# Patient Record
Sex: Female | Born: 1965 | Race: White | Hispanic: No | Marital: Married | Smoking: Never smoker
Health system: Southern US, Community
[De-identification: ages and names within clinical notes are randomized; demographics above are authoritative.]

## PROBLEM LIST (undated history)

## (undated) DIAGNOSIS — F40240 Claustrophobia: Secondary | ICD-10-CM

## (undated) DIAGNOSIS — E669 Obesity, unspecified: Secondary | ICD-10-CM

## (undated) DIAGNOSIS — R51 Headache: Secondary | ICD-10-CM

## (undated) DIAGNOSIS — E7800 Pure hypercholesterolemia, unspecified: Secondary | ICD-10-CM

## (undated) DIAGNOSIS — J309 Allergic rhinitis, unspecified: Secondary | ICD-10-CM

## (undated) DIAGNOSIS — R519 Headache, unspecified: Secondary | ICD-10-CM

## (undated) DIAGNOSIS — R0600 Dyspnea, unspecified: Secondary | ICD-10-CM

## (undated) DIAGNOSIS — D3A026 Benign carcinoid tumor of the rectum: Secondary | ICD-10-CM

## (undated) DIAGNOSIS — D649 Anemia, unspecified: Secondary | ICD-10-CM

## (undated) HISTORY — PX: DILATION AND CURETTAGE OF UTERUS: SHX78

---

## 1993-10-13 HISTORY — PX: DILATION AND CURETTAGE, DIAGNOSTIC / THERAPEUTIC: SUR384

## 2005-12-31 ENCOUNTER — Ambulatory Visit: Payer: Self-pay | Admitting: Obstetrics and Gynecology

## 2007-01-05 ENCOUNTER — Ambulatory Visit: Payer: Self-pay | Admitting: Obstetrics and Gynecology

## 2008-01-07 ENCOUNTER — Ambulatory Visit: Payer: Self-pay | Admitting: Obstetrics and Gynecology

## 2009-01-09 ENCOUNTER — Ambulatory Visit: Payer: Self-pay | Admitting: Obstetrics and Gynecology

## 2010-01-14 ENCOUNTER — Ambulatory Visit: Payer: Self-pay | Admitting: Obstetrics and Gynecology

## 2011-01-21 ENCOUNTER — Ambulatory Visit: Payer: Self-pay | Admitting: Obstetrics and Gynecology

## 2012-01-27 ENCOUNTER — Ambulatory Visit: Payer: Self-pay | Admitting: Obstetrics and Gynecology

## 2013-02-01 ENCOUNTER — Ambulatory Visit: Payer: Self-pay | Admitting: Obstetrics and Gynecology

## 2014-04-25 ENCOUNTER — Ambulatory Visit: Payer: Self-pay | Admitting: Obstetrics and Gynecology

## 2014-06-02 ENCOUNTER — Ambulatory Visit: Payer: Self-pay | Admitting: Unknown Physician Specialty

## 2014-06-02 DIAGNOSIS — D3A026 Benign carcinoid tumor of the rectum: Secondary | ICD-10-CM

## 2014-06-02 HISTORY — DX: Benign carcinoid tumor of the rectum: D3A.026

## 2014-06-02 HISTORY — PX: COLONOSCOPY: SHX174

## 2014-06-09 LAB — PATHOLOGY REPORT

## 2014-08-03 HISTORY — PX: FLEXIBLE SIGMOIDOSCOPY: SHX1649

## 2015-04-27 ENCOUNTER — Other Ambulatory Visit: Payer: Self-pay | Admitting: Obstetrics and Gynecology

## 2015-04-27 DIAGNOSIS — Z1231 Encounter for screening mammogram for malignant neoplasm of breast: Secondary | ICD-10-CM

## 2015-05-01 ENCOUNTER — Ambulatory Visit
Admission: RE | Admit: 2015-05-01 | Discharge: 2015-05-01 | Disposition: A | Discharge status: Home / Self Care | Payer: BLUE CROSS/BLUE SHIELD | Source: Ambulatory Visit | Attending: Obstetrics and Gynecology | Admitting: Obstetrics and Gynecology

## 2015-05-01 DIAGNOSIS — Z1231 Encounter for screening mammogram for malignant neoplasm of breast: Secondary | ICD-10-CM | POA: Insufficient documentation

## 2016-11-06 ENCOUNTER — Other Ambulatory Visit: Payer: Self-pay | Admitting: Obstetrics and Gynecology

## 2016-11-06 DIAGNOSIS — Z1231 Encounter for screening mammogram for malignant neoplasm of breast: Secondary | ICD-10-CM

## 2016-11-20 ENCOUNTER — Ambulatory Visit
Admission: RE | Admit: 2016-11-20 | Discharge: 2016-11-20 | Disposition: A | Discharge status: Home / Self Care | Payer: BLUE CROSS/BLUE SHIELD | Source: Ambulatory Visit | Attending: Obstetrics and Gynecology | Admitting: Obstetrics and Gynecology

## 2016-11-20 DIAGNOSIS — Z1231 Encounter for screening mammogram for malignant neoplasm of breast: Secondary | ICD-10-CM | POA: Insufficient documentation

## 2017-10-12 ENCOUNTER — Encounter: Payer: Self-pay | Admitting: *Deleted

## 2017-10-14 ENCOUNTER — Ambulatory Visit
Admission: RE | Admit: 2017-10-14 | Discharge: 2017-10-14 | Disposition: A | Discharge status: Home / Self Care | Payer: BLUE CROSS/BLUE SHIELD | Source: Ambulatory Visit | Attending: Unknown Physician Specialty | Admitting: Unknown Physician Specialty

## 2017-10-14 ENCOUNTER — Ambulatory Visit: Payer: BLUE CROSS/BLUE SHIELD | Admitting: Registered Nurse

## 2017-10-14 ENCOUNTER — Encounter
Admission: RE | Disposition: A | Discharge status: Home / Self Care | Payer: Self-pay | Source: Ambulatory Visit | Attending: Unknown Physician Specialty

## 2017-10-14 DIAGNOSIS — K640 First degree hemorrhoids: Secondary | ICD-10-CM | POA: Diagnosis not present

## 2017-10-14 DIAGNOSIS — Z79899 Other long term (current) drug therapy: Secondary | ICD-10-CM | POA: Insufficient documentation

## 2017-10-14 DIAGNOSIS — Z6841 Body Mass Index (BMI) 40.0 and over, adult: Secondary | ICD-10-CM | POA: Insufficient documentation

## 2017-10-14 DIAGNOSIS — E7800 Pure hypercholesterolemia, unspecified: Secondary | ICD-10-CM | POA: Insufficient documentation

## 2017-10-14 DIAGNOSIS — Z85030 Personal history of malignant carcinoid tumor of large intestine: Secondary | ICD-10-CM | POA: Diagnosis not present

## 2017-10-14 HISTORY — DX: Headache: R51

## 2017-10-14 HISTORY — PX: COLONOSCOPY: SHX5424

## 2017-10-14 HISTORY — DX: Obesity, unspecified: E66.9

## 2017-10-14 HISTORY — DX: Headache, unspecified: R51.9

## 2017-10-14 HISTORY — DX: Allergic rhinitis, unspecified: J30.9

## 2017-10-14 HISTORY — DX: Anemia, unspecified: D64.9

## 2017-10-14 HISTORY — DX: Benign carcinoid tumor of the rectum: D3A.026

## 2017-10-14 HISTORY — DX: Pure hypercholesterolemia, unspecified: E78.00

## 2017-10-14 LAB — POCT PREGNANCY, URINE: PREG TEST UR: NEGATIVE

## 2017-10-14 SURGERY — COLONOSCOPY
Anesthesia: General

## 2017-10-14 MED ORDER — LIDOCAINE HCL (PF) 1 % IJ SOLN
INTRAMUSCULAR | Status: AC
  Administered 2017-10-14: 0.3 mL via INTRADERMAL
  Filled 2017-10-14: qty 2

## 2017-10-14 MED ORDER — SODIUM CHLORIDE 0.9 % IV SOLN
INTRAVENOUS | Status: DC
  Administered 2017-10-14: 1000 mL via INTRAVENOUS
  Administered 2017-10-14: 12:00:00 via INTRAVENOUS

## 2017-10-14 MED ORDER — PROPOFOL 500 MG/50ML IV EMUL
INTRAVENOUS | Status: DC | PRN
  Administered 2017-10-14: 100 ug/kg/min via INTRAVENOUS

## 2017-10-14 MED ORDER — SODIUM CHLORIDE 0.9 % IV SOLN: INTRAVENOUS | Status: DC

## 2017-10-14 MED ORDER — PROPOFOL 10 MG/ML IV BOLUS
INTRAVENOUS | Status: DC | PRN
  Administered 2017-10-14 (×2): 20 mg via INTRAVENOUS

## 2017-10-14 MED ORDER — LIDOCAINE HCL (PF) 1 % IJ SOLN
2.0000 mL | Freq: Once | INTRAMUSCULAR | Status: AC
  Administered 2017-10-14: 0.3 mL via INTRADERMAL

## 2017-10-14 NOTE — Anesthesia Post-op Follow-up Note (Signed)
Anesthesia QCDR form completed.        

## 2017-10-14 NOTE — H&P (Signed)
Primary Care Physician:  Ezequiel Kayser, MD Primary Gastroenterologist:  Dr. Vira Agar  Pre-Procedure History & Physical: HPI:  Daisy Rivera is a 52 y.o. female is here for an colonoscopy.   Past Medical History:  Diagnosis Date  . Allergic rhinitis   . Anemia   . 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 Headache   . Hypercholesterolemia   . Obesity     Past Surgical History:  Procedure Laterality Date  . CESAREAN SECTION  1999  . COLONOSCOPY  06/02/2014  . DILATION AND CURETTAGE, DIAGNOSTIC / THERAPEUTIC  1995  . FLEXIBLE SIGMOIDOSCOPY  08/03/2014    Prior to Admission medications   Medication Sig Start Date End Date Taking? Authorizing Provider  Calcium Citrate-Vitamin D 200-250 MG-UNIT TABS Take by mouth.   Yes [provider]  diphenhydrAMINE (BENADRYL) 12.5 MG/5ML elixir Take by mouth 4 (four) times daily as needed.   Yes [provider]  EPINEPHrine 0.3 mg/0.3 mL IJ SOAJ injection Inject into the muscle once.   Yes [provider]  Ferrous Fumarate (HEMOCYTE - 106 MG FE) 324 (106 Fe) MG TABS tablet Take 1 tablet by mouth.   Yes [provider]  ferrous sulfate 325 (65 FE) MG tablet Take 325 mg by mouth daily with breakfast.   Yes [provider]  fexofenadine (ALLEGRA) 180 MG tablet Take 180 mg by mouth daily.   Yes [provider]  glucosamine-chondroitin 500-400 MG tablet Take 1 tablet by mouth 3 (three) times daily.   Yes [provider]  ibuprofen (ADVIL,MOTRIN) 600 MG tablet Take 600 mg by mouth every 6 (six) hours as needed.   Yes [provider]  Inulin 2 g CHEW Chew by mouth.   Yes [provider]  medroxyPROGESTERone (PROVERA) 10 MG tablet Take 10 mg by mouth daily.   Yes [provider]  Multiple Vitamin (MULTIVITAMIN) capsule Take 1 capsule by mouth daily.   Yes [provider]  Omega-3 Fatty Acids (OMEGA-3 EPA FISH OIL PO) Take by mouth.   Yes [provider]  vitamin C (ASCORBIC ACID) 500 MG tablet Take 500 mg by mouth daily.   Yes [provider]    Allergies as of 08/31/2017  . (Not on File)    Family History  Problem Relation Age of Onset  . Hypertension Mother   . Cerebral aneurysm Mother   . Dementia Father   . Hypertension Father   . Diabetes Sister   . Breast cancer Neg Hx     Social History   Socioeconomic History  . Marital status: Married    Spouse name: Not on file  . Number of children: Not on file  . Years of education: Not on file  . Highest education level: Not on file  Social Needs  . Financial resource strain: Not on file  . Food insecurity - worry: Not on file  . Food insecurity - inability: Not on file  . Transportation needs - medical: Not on file  . Transportation needs - non-medical: Not on file  Occupational History  . Not on file  Tobacco Use  . Smoking status: Never Smoker  . Smokeless tobacco: Never Used  Substance and Sexual Activity  . Alcohol use: Yes    Frequency: Never    Comment: rarely  . Drug use: No  . Sexual activity: Yes  Other Topics Concern  . Not on file  Social History Narrative  . Not on file  Review of Systems: See HPI, otherwise negative ROS  Physical Exam: BP (!) 164/75   Pulse 80   Temp 98.6 F (37 C) (Tympanic)   Resp 18   Ht 5\' 9"  (1.753 m)   Wt (!) 149.7 kg (330 lb)   SpO2 100%   BMI 48.73 kg/m  General:   Alert,  pleasant and cooperative in NAD Head:  Normocephalic and atraumatic. Neck:  Supple; no masses or thyromegaly. Lungs:  Clear throughout to auscultation.    Heart:  Regular rate and rhythm. Abdomen:  Soft, nontender and nondistended. Normal bowel sounds, without guarding, and without rebound.   Neurologic:  Alert and  oriented x4;  grossly normal neurologically.  Impression/Plan: Daisy Rivera is here for an colonoscopy to be performed for follow up carcinoid colon tumor.  Risks, benefits, limitations, and  alternatives regarding  colonoscopy have been reviewed with the patient.  Questions have been answered.  All parties agreeable.   Gaylyn Cheers, MD  10/14/2017, 11:49 AM

## 2017-10-14 NOTE — Op Note (Signed)
[facility] Gastroenterology Patient Name: Daisy Rivera Procedure Date: 10/14/2017 11:39 AM MRN: [id number] Account #: [id number] Date of Birth: [birth year] Admit Type: Outpatient Age: 52 Room: [facility] ENDO ROOM 3 Gender: Female Note Status: Finalized Procedure:            Colonoscopy Indications:          Follow up carcinoid tumor. Providers:            Manya Silvas, MD Complications:        No immediate complications. Procedure:            Pre-Anesthesia Assessment:                       - After reviewing the risks and benefits, the patient                        was deemed in satisfactory condition to undergo the                        procedure.                       After obtaining informed consent, the colonoscope was                        passed under direct vision. Throughout the procedure,                        the patient's blood pressure, pulse, and oxygen                        saturations were monitored continuously. The                        Colonoscope was introduced through the anus and                        advanced to the the cecum, identified by appendiceal                        orifice and ileocecal valve. The colonoscopy was                        performed without difficulty. The patient tolerated the                        procedure well. The quality of the bowel preparation                        was excellent. Findings:      Internal hemorrhoids were found during endoscopy. The hemorrhoids were       small and Grade I (internal hemorrhoids that do not prolapse).      The exam was otherwise without abnormality. Prep was excellent. Impression:           - Internal hemorrhoids.                       - The examination was otherwise normal.                       - No specimens collected. Recommendation:       -  Repeat colonoscopy in 5 years for surveillance. Manya Silvas, MD 10/14/2017 12:38:20 PM This report has been signed  electronically. Number of Addenda: 0 Note Initiated On: 10/14/2017 11:39 AM Scope Withdrawal Time: 0 hours 10 minutes 8 seconds  Total Procedure Duration: 0 hours 19 minutes 39 seconds       [facility]

## 2017-10-14 NOTE — Transfer of Care (Signed)
Immediate Anesthesia Transfer of Care Note  Patient: Daisy Rivera  Procedure(s) Performed: COLONOSCOPY (N/A )  Patient Location: PACU  Anesthesia Type:General  Level of Consciousness: awake, alert  and oriented  Airway & Oxygen Therapy: Patient Spontanous Breathing  Post-op Assessment: Report given to RN  Post vital signs: Reviewed and stable  Last Vitals:  Vitals:   10/14/17 1139  BP: (!) 164/75  Pulse: 80  Resp: 18  Temp: 37 C  SpO2: 100%    Last Pain:  Vitals:   10/14/17 1139  TempSrc: Tympanic         Complications: No apparent anesthesia complications

## 2017-10-15 ENCOUNTER — Encounter: Payer: Self-pay | Admitting: Unknown Physician Specialty

## 2017-10-15 NOTE — Anesthesia Postprocedure Evaluation (Signed)
Anesthesia Post Note  Patient: Karen Chafe Head  Procedure(s) Performed: COLONOSCOPY (N/A )  Patient location during evaluation: PACU Anesthesia Type: General Level of consciousness: awake and alert Pain management: pain level controlled Vital Signs Assessment: post-procedure vital signs reviewed and stable Respiratory status: spontaneous breathing, nonlabored ventilation, respiratory function stable and patient connected to nasal cannula oxygen Cardiovascular status: blood pressure returned to baseline and stable Postop Assessment: no apparent nausea or vomiting Anesthetic complications: no     Last Vitals:  Vitals:   10/14/17 1244 10/14/17 1254  BP: 128/68 122/68  Pulse: 70 74  Resp: 18 16  Temp:    SpO2: 98% 98%    Last Pain:  Vitals:   10/15/17 0724  TempSrc:   PainSc: 0-No pain                 Molli Barrows

## 2017-10-15 NOTE — Anesthesia Preprocedure Evaluation (Signed)
Anesthesia Evaluation  Patient identified by MRN, date of birth, ID band Patient awake    Reviewed: Allergy & Precautions, H&P , NPO status , Patient's Chart, lab work & pertinent test results, reviewed documented beta blocker date and time   Airway Mallampati: II   Neck ROM: full    Dental  (+) Poor Dentition   Pulmonary neg pulmonary ROS,    Pulmonary exam normal        Cardiovascular negative cardio ROS Normal cardiovascular exam Rhythm:regular Rate:Normal     Neuro/Psych  Headaches, negative neurological ROS  negative psych ROS   GI/Hepatic negative GI ROS, Neg liver ROS,   Endo/Other  negative endocrine ROSMorbid obesity  Renal/GU negative Renal ROS  negative genitourinary   Musculoskeletal   Abdominal   Peds  Hematology negative hematology ROS (+) anemia ,   Anesthesia Other Findings Past Medical History: No date: Allergic rhinitis No date: Anemia 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 No date: Headache No date: Hypercholesterolemia No date: Obesity Past Surgical History: 1999: CESAREAN SECTION 06/02/2014: COLONOSCOPY 10/14/2017: COLONOSCOPY; N/A     Comment:  Procedure: COLONOSCOPY;  Surgeon: Manya Silvas, MD;              Location: [facility] ENDOSCOPY;  Service: Endoscopy;                Laterality: N/A; 1995: DILATION AND CURETTAGE, DIAGNOSTIC / THERAPEUTIC 08/03/2014: FLEXIBLE SIGMOIDOSCOPY BMI    Body Mass Index:  48.73 kg/m     Reproductive/Obstetrics negative OB ROS                             Anesthesia Physical Anesthesia Plan  ASA: III  Anesthesia Plan: General   Post-op Pain Management:    Induction:   PONV Risk Score and Plan:   Airway Management Planned:   Additional Equipment:   Intra-op Plan:   Post-operative Plan:   Informed Consent: I have reviewed the patients History and Physical, chart, labs and discussed the procedure including  the risks, benefits and alternatives for the proposed anesthesia with the patient or authorized representative who has indicated his/her understanding and acceptance.   Dental Advisory Given  Plan Discussed with: CRNA  Anesthesia Plan Comments:         Anesthesia Quick Evaluation

## 2017-11-12 ENCOUNTER — Other Ambulatory Visit: Payer: Self-pay | Admitting: Obstetrics and Gynecology

## 2017-11-12 DIAGNOSIS — Z1239 Encounter for other screening for malignant neoplasm of breast: Secondary | ICD-10-CM

## 2017-12-08 NOTE — H&P (Signed)
Daisy Rivera is a 52 y.o. female here for [place] . Follow up for elevated FT4 and PMB   off biotin FT4 normalized   embx : proliferative   SIS today : endometrial mass 1.46x1.2x1.54cm  Past Medical History:  has a past medical history of Anemia, unspecified, Benign carcinoid tumor of rectum (06/02/2014), Chronic headaches, Hypercholesterolemia, Obesity, unspecified, and Seasonal allergic rhinitis.  Past Surgical History:  has a past surgical history that includes Cesarean section (1999); Colonoscopy (06/02/2014); Sigmoidoscopy Flexible (08/03/2014); Dilation and curettage, diagnostic / therapeutic (1995); and Colonoscopy (10/14/2017). Family History: family history includes Cerebral aneurysm in her mother; Dementia in her father; Diabetes in her sister; Diabetes type II in her sister; High blood pressure (Hypertension) in her father and mother; No Known Problems in her paternal grandfather. Social History:  reports that she has never smoked. She has never used smokeless tobacco. She reports that she drinks alcohol. She reports that she does not use drugs. OB/GYN History:          OB History    Gravida  3   Para  2   Term  2   Preterm      AB  1   Living  2     SAB      TAB      Ectopic      Molar      Multiple      Live Births  2          Allergies: is allergic to codeine and vicodin [hydrocodone-acetaminophen]. Medications:  Current Outpatient Medications:  .  ALLEGRA ALLERGY 180 mg tablet, TK 1 T PO QD, Disp: , Rfl: 3 .  ascorbic acid (VITAMIN C) 500 MG tablet, Take 500 mg by mouth once daily., Disp: , Rfl:  .  aspirin 81 MG EC tablet, Take 81 mg by mouth once daily, Disp: , Rfl:  .  calcium citrate-vitamin D3 (CITRACAL+D PETITES) 200 mg calcium -250 unit tablet, Take 1 tablet by mouth once daily., Disp: , Rfl:  .  diphenhydrAMINE (BENADRYL) 12.5 mg/5 mL elixir, Take 12.5 mg by mouth once a week. After allergy shot, Disp: , Rfl:  .  EPINEPHrine  (EPIPEN) 0.3 mg/0.3 mL pen injector, , Disp: , Rfl: 11 .  ferrous fumarate/vit Bcomp,C (SUPER B COMPLEX ORAL), Take 1 tablet by mouth once daily., Disp: , Rfl:  .  ferrous sulfate (IRON, FERROUS SULFATE,) 325 (65 FE) MG tablet, Take 325 mg by mouth 2 (two) times daily with meals., Disp: , Rfl:  .  GLUC/CHON-MSM#1/VIT C/MANG/BOR (GLUCOSAMINE-CHOND-MSM COMPLEX ORAL), Take 2 tablets by mouth once daily., Disp: , Rfl:  .  ibuprofen (ADVIL,MOTRIN) 600 MG tablet, Take 600 mg by mouth every 6 (six) hours as needed for Pain., Disp: , Rfl:  .  inulin-sorbitol 2 gram Chew, Take 3 tablets by mouth once daily.  , Disp: , Rfl:  .  medroxyPROGESTERone (PROVERA) 10 MG tablet, TAKE 1 TABLET(10 MG) BY MOUTH EVERY DAY, Disp: 10 tablet, Rfl: 0 .  multivitamin capsule, Take 1 capsule by mouth once daily., Disp: , Rfl:  .  omega-3 fatty acids/fish oil 340-1,000 mg capsule, Take 2 capsules by mouth once daily., Disp: , Rfl:  .  UNABLE TO FIND, Weekly allergy shots, Disp: , Rfl:   Review of Systems: General:                      No fatigue or weight loss Eyes:  No vision changes Ears:                            No hearing difficulty Respiratory:                No cough or shortness of breath Pulmonary:                  No asthma or shortness of breath Cardiovascular:           No chest pain, palpitations, dyspnea on exertion Gastrointestinal:          No abdominal bloating, chronic diarrhea, constipations, masses, pain or hematochezia Genitourinary:             No hematuria, dysuria, abnormal vaginal discharge, pelvic pain, Menometrorrhagia, + PMB  Lymphatic:                   No swollen lymph nodes Musculoskeletal:         No muscle weakness Neurologic:                  No extremity weakness, syncope, seizure disorder Psychiatric:                  No history of depression, delusions or suicidal/homicidal ideation    Exam:      Vitals:   12/02/17 1345  BP: 136/89  Pulse: 93     Body mass index is 48.44 kg/m.  WDWN white/ female in NAD   Lungs: CTA  CV : RRR without murmur   Neck:  no thyromegaly Abdomen: soft , no mass, normal active bowel sounds,  non-tender, no rebound tenderness Pelvic: tanner stage 5 ,  External genitalia: vulva /labia no lesions Urethra: no prolapse Vagina: normal physiologic d/c Cervix: no lesions, no cervical motion tenderness   Uterus: normal size shape and contour, non-tender Adnexa: no mass,  non-tender    Saline infusion sonohysterography: betadine prep to the cervix followed by placement of the HSG catheter into the endometrial canal . Sterile H2O is injected while performing a transvaginal u/s . Findings: 1.4x1.2cmx1.54 Impression:   The primary encounter diagnosis was PMB (postmenopausal bleeding). A diagnosis of Endometrial mass was also pertinent to this visit.    Plan:  Spoke to her about surgery to remove poly/ mass. Recommend Fx D+C and myosure resection of endometrial mass  Benefits and risks to surgery: The proposed benefit of the surgery has been discussed with the patient. The possible risks include, but are not limited to: organ injury to the bowel , bladder, ureters, and major blood vessels and nerves. There is a possibility of additional surgeries resulting from these injuries. There is also the risk of blood transfusion and the need to receive blood products during or after the procedure which may rarely lead to HIV or Hepatitis C infection. There is a risk of developing a deep venous thrombosis or a pulmonary embolism . There is the possibility of wound infection and also anesthetic complications, even the rare possibility of death. The patient understands these risks and wishes to proceed. All questions have been answered and the consent has been signed.     Return if symptoms worsen or fail to improve, for preop.  Caroline Sauger, MD

## 2017-12-09 ENCOUNTER — Encounter
Admission: RE | Admit: 2017-12-09 | Discharge: 2017-12-09 | Disposition: A | Discharge status: Home / Self Care | Payer: BLUE CROSS/BLUE SHIELD | Source: Ambulatory Visit | Attending: Obstetrics and Gynecology | Admitting: Obstetrics and Gynecology

## 2017-12-09 ENCOUNTER — Other Ambulatory Visit: Payer: Self-pay

## 2017-12-09 DIAGNOSIS — Z1231 Encounter for screening mammogram for malignant neoplasm of breast: Secondary | ICD-10-CM | POA: Diagnosis present

## 2017-12-09 DIAGNOSIS — Z01810 Encounter for preprocedural cardiovascular examination: Secondary | ICD-10-CM | POA: Insufficient documentation

## 2017-12-09 DIAGNOSIS — Z01812 Encounter for preprocedural laboratory examination: Secondary | ICD-10-CM | POA: Insufficient documentation

## 2017-12-09 DIAGNOSIS — R0602 Shortness of breath: Secondary | ICD-10-CM | POA: Insufficient documentation

## 2017-12-09 HISTORY — DX: Claustrophobia: F40.240

## 2017-12-09 HISTORY — DX: Dyspnea, unspecified: R06.00

## 2017-12-09 LAB — CBC
HCT: 42 % (ref 35.0–47.0)
Hemoglobin: 14 g/dL (ref 12.0–16.0)
MCH: 30.1 pg (ref 26.0–34.0)
MCHC: 33.3 g/dL (ref 32.0–36.0)
MCV: 90.5 fL (ref 80.0–100.0)
Platelets: 215 10*3/uL (ref 150–440)
RBC: 4.64 MIL/uL (ref 3.80–5.20)
RDW: 13.9 % (ref 11.5–14.5)
WBC: 4.4 10*3/uL (ref 3.6–11.0)

## 2017-12-09 LAB — BASIC METABOLIC PANEL
Anion gap: 7 (ref 5–15)
BUN: 17 mg/dL (ref 6–20)
CO2: 28 mmol/L (ref 22–32)
CREATININE: 0.66 mg/dL (ref 0.44–1.00)
Calcium: 9.3 mg/dL (ref 8.9–10.3)
Chloride: 105 mmol/L (ref 101–111)
GFR calc Af Amer: >60 mL/min (ref >60)
GFR calc non Af Amer: >60 mL/min (ref >60)
Glucose, Bld: 102 mg/dL — ABNORMAL HIGH (ref 65–99)
Potassium: 4.5 mmol/L (ref 3.5–5.1)
SODIUM: 140 mmol/L (ref 135–145)

## 2017-12-09 LAB — TYPE AND SCREEN
ABO/RH(D): A NEG
Antibody Screen: NEGATIVE

## 2017-12-09 NOTE — Pre-Procedure Instructions (Signed)
AS INSTRUCTED BY DR Lenna Sciara ADAMS, REQUEST FOR CLEARANCE/ ABNORMAL EKG,SOB CALLED AND FAXED TO DR THIESS. SPOKE WITH BARBARA. ALSO FAXED TO DR [place] SPOKE WITH JESSICA

## 2017-12-09 NOTE — Patient Instructions (Addendum)
Your procedure is scheduled on: 12/18/17 Fri Report to Same Day Surgery 2nd floor medical mall [facility] Entrance-take elevator on left to 2nd floor.  Check in with surgery information desk.) To find out your arrival time please call [phone] between 1PM - 3PM on 12/17/17 Thurs  Remember: Instructions that are not followed completely may result in serious medical risk, up to and including death, or upon the discretion of your surgeon and anesthesiologist your surgery may need to be rescheduled.    _x___ 1. Do not eat food after midnight the night before your procedure. You may drink clear liquids up to 2 hours before you are scheduled to arrive at the hospital for your procedure.  Do not drink clear liquids within 2 hours of your scheduled arrival to the hospital.  Clear liquids include  --Water or Apple juice without pulp  --Clear carbohydrate beverage such as ClearFast or Gatorade  --Black Coffee or Clear Tea (No milk, no creamers, do not add anything to                  the coffee or Tea Type 1 and type 2 diabetics should only drink water.  No gum chewing or hard candies.     __x__ 2. No Alcohol for 24 hours before or after surgery.   __x__3. No Smoking or e-cigarettes for 24 prior to surgery.  Do not use any chewable tobacco products for at least 6 hour prior to surgery   ____  4. Bring all medications with you on the day of surgery if instructed.    __x__ 5. Notify your doctor if there is any change in your medical condition     (cold, fever, infections).    x___6. On the morning of surgery brush your teeth with toothpaste and water.  You may rinse your mouth with mouth wash if you wish.  Do not swallow any toothpaste or mouthwash.   Do not wear jewelry, make-up, hairpins, clips or nail polish.  Do not wear lotions, powders, or perfumes. You may wear deodorant.  Do not shave 48 hours prior to surgery. Men may shave face and neck.  Do not bring valuables to the hospital.     [facility] is not responsible for any belongings or valuables.               Contacts, dentures or bridgework may not be worn into surgery.  Leave your suitcase in the car. After surgery it may be brought to your room.  For patients admitted to the hospital, discharge time is determined by your                       treatment team.  _  Patients discharged the day of surgery will not be allowed to drive home.  You will need someone to drive you home and stay with you the night of your procedure.    Please read over the following fact sheets that you were given:   [facility] Preparing for Surgery and or MRSA Information   _x___ Take anti-hypertensive listed below, cardiac, seizure, asthma,     anti-reflux and psychiatric medicines. These include:  1. NONE  2.  3.  4.  5.  6.  ____Fleets enema or Magnesium Citrate as directed.   ____ Use CHG Soap or sage wipes as directed on instruction sheet   ____ Use inhalers on the day of surgery and bring to hospital day of surgery  ____ Stop Metformin and Janumet 2 days prior to surgery.    ____ Take 1/2 of usual insulin dose the night before surgery and none on the morning     surgery.   _x___ Follow recommendations from Cardiologist, Pulmonologist or PCP regarding          stopping Aspirin, Coumadin, Plavix ,Eliquis, Effient, or Pradaxa, and Pletal.  X____Stop Anti-inflammatories such as Advil, Aleve, Ibuprofen, Motrin, Naproxen, Naprosyn, Goodies powders or aspirin products. OK to take Tylenol and    Celebrex. Stop Aspirin, ibuprofen and fish oils 1 week before surgery   _x___ Stop supplements until after surgery.  But may continue Vitamin D, Vitamin B,       and multivitamin.   ____ Bring C-Pap to the hospital.

## 2017-12-10 ENCOUNTER — Inpatient Hospital Stay: Admission: RE | Admit: 2017-12-10 | Payer: BLUE CROSS/BLUE SHIELD | Source: Ambulatory Visit

## 2017-12-10 ENCOUNTER — Ambulatory Visit
Admission: RE | Admit: 2017-12-10 | Discharge: 2017-12-10 | Disposition: A | Discharge status: Home / Self Care | Payer: BLUE CROSS/BLUE SHIELD | Source: Ambulatory Visit | Attending: Obstetrics and Gynecology | Admitting: Obstetrics and Gynecology

## 2017-12-10 DIAGNOSIS — Z1239 Encounter for other screening for malignant neoplasm of breast: Secondary | ICD-10-CM

## 2017-12-10 DIAGNOSIS — Z01812 Encounter for preprocedural laboratory examination: Secondary | ICD-10-CM | POA: Diagnosis not present

## 2017-12-15 NOTE — Pre-Procedure Instructions (Signed)
CLEARED BY DR CALLWOOD LOW RISK 12/14/17

## 2017-12-18 ENCOUNTER — Ambulatory Visit: Payer: BLUE CROSS/BLUE SHIELD | Admitting: Anesthesiology

## 2017-12-18 ENCOUNTER — Encounter: Payer: Self-pay | Admitting: Emergency Medicine

## 2017-12-18 ENCOUNTER — Encounter
Admission: RE | Disposition: A | Discharge status: Home / Self Care | Payer: Self-pay | Source: Ambulatory Visit | Attending: Obstetrics and Gynecology

## 2017-12-18 ENCOUNTER — Ambulatory Visit
Admission: RE | Admit: 2017-12-18 | Discharge: 2017-12-18 | Disposition: A | Discharge status: Home / Self Care | Payer: BLUE CROSS/BLUE SHIELD | Source: Ambulatory Visit | Attending: Obstetrics and Gynecology | Admitting: Obstetrics and Gynecology

## 2017-12-18 DIAGNOSIS — E7800 Pure hypercholesterolemia, unspecified: Secondary | ICD-10-CM | POA: Diagnosis not present

## 2017-12-18 DIAGNOSIS — Z7982 Long term (current) use of aspirin: Secondary | ICD-10-CM | POA: Insufficient documentation

## 2017-12-18 DIAGNOSIS — Z833 Family history of diabetes mellitus: Secondary | ICD-10-CM | POA: Insufficient documentation

## 2017-12-18 DIAGNOSIS — F419 Anxiety disorder, unspecified: Secondary | ICD-10-CM | POA: Diagnosis not present

## 2017-12-18 DIAGNOSIS — Z885 Allergy status to narcotic agent status: Secondary | ICD-10-CM | POA: Diagnosis not present

## 2017-12-18 DIAGNOSIS — Z79899 Other long term (current) drug therapy: Secondary | ICD-10-CM | POA: Diagnosis not present

## 2017-12-18 DIAGNOSIS — R51 Headache: Secondary | ICD-10-CM | POA: Insufficient documentation

## 2017-12-18 DIAGNOSIS — Z8249 Family history of ischemic heart disease and other diseases of the circulatory system: Secondary | ICD-10-CM | POA: Insufficient documentation

## 2017-12-18 DIAGNOSIS — N950 Postmenopausal bleeding: Secondary | ICD-10-CM | POA: Insufficient documentation

## 2017-12-18 DIAGNOSIS — N840 Polyp of corpus uteri: Secondary | ICD-10-CM | POA: Insufficient documentation

## 2017-12-18 DIAGNOSIS — Z6841 Body Mass Index (BMI) 40.0 and over, adult: Secondary | ICD-10-CM | POA: Insufficient documentation

## 2017-12-18 HISTORY — PX: DILATATION & CURETTAGE/HYSTEROSCOPY WITH MYOSURE: SHX6511

## 2017-12-18 LAB — POCT PREGNANCY, URINE: Preg Test, Ur: NEGATIVE

## 2017-12-18 LAB — ABO/RH: ABO/RH(D): A NEG

## 2017-12-18 SURGERY — DILATATION & CURETTAGE/HYSTEROSCOPY WITH MYOSURE
Anesthesia: General

## 2017-12-18 MED ORDER — CEFAZOLIN SODIUM-DEXTROSE 1-4 GM/50ML-% IV SOLN
INTRAVENOUS | Status: AC
  Filled 2017-12-18: qty 50

## 2017-12-18 MED ORDER — SILVER NITRATE-POT NITRATE 75-25 % EX MISC
CUTANEOUS | Status: DC | PRN
  Administered 2017-12-18: 3 via TOPICAL

## 2017-12-18 MED ORDER — ONDANSETRON HCL 4 MG/2ML IJ SOLN: 4.0000 mg | Freq: Once | INTRAMUSCULAR | Status: DC | PRN

## 2017-12-18 MED ORDER — SUCCINYLCHOLINE CHLORIDE 20 MG/ML IJ SOLN
INTRAMUSCULAR | Status: DC | PRN
  Administered 2017-12-18: 100 mg via INTRAVENOUS

## 2017-12-18 MED ORDER — MIDAZOLAM HCL 2 MG/2ML IJ SOLN
INTRAMUSCULAR | Status: AC
  Filled 2017-12-18: qty 4

## 2017-12-18 MED ORDER — SUGAMMADEX SODIUM 500 MG/5ML IV SOLN
INTRAVENOUS | Status: DC | PRN
  Administered 2017-12-18: 400 mg via INTRAVENOUS

## 2017-12-18 MED ORDER — FAMOTIDINE 20 MG PO TABS
20.0000 mg | ORAL_TABLET | Freq: Once | ORAL | Status: AC
  Administered 2017-12-18: 20 mg via ORAL

## 2017-12-18 MED ORDER — FAMOTIDINE 20 MG PO TABS
ORAL_TABLET | ORAL | Status: AC
Start: 2017-12-18 — End: 2017-12-18
  Administered 2017-12-18: 20 mg via ORAL
  Filled 2017-12-18: qty 1

## 2017-12-18 MED ORDER — CEFAZOLIN SODIUM-DEXTROSE 1-4 GM/50ML-% IV SOLN
INTRAVENOUS | Status: DC | PRN
  Administered 2017-12-18: 1 g via INTRAVENOUS

## 2017-12-18 MED ORDER — FENTANYL CITRATE (PF) 100 MCG/2ML IJ SOLN
INTRAMUSCULAR | Status: AC
  Filled 2017-12-18: qty 2

## 2017-12-18 MED ORDER — DEXAMETHASONE SODIUM PHOSPHATE 10 MG/ML IJ SOLN
INTRAMUSCULAR | Status: DC | PRN
  Administered 2017-12-18: 10 mg via INTRAVENOUS

## 2017-12-18 MED ORDER — ROCURONIUM BROMIDE 100 MG/10ML IV SOLN
INTRAVENOUS | Status: DC | PRN
  Administered 2017-12-18: 10 mg via INTRAVENOUS
  Administered 2017-12-18: 30 mg via INTRAVENOUS

## 2017-12-18 MED ORDER — PROPOFOL 10 MG/ML IV BOLUS
INTRAVENOUS | Status: AC
  Filled 2017-12-18: qty 20

## 2017-12-18 MED ORDER — CEFAZOLIN SODIUM-DEXTROSE 2-4 GM/100ML-% IV SOLN
INTRAVENOUS | Status: AC
  Filled 2017-12-18: qty 100

## 2017-12-18 MED ORDER — KETOROLAC TROMETHAMINE 30 MG/ML IJ SOLN
INTRAMUSCULAR | Status: AC
  Filled 2017-12-18: qty 1

## 2017-12-18 MED ORDER — PROPOFOL 10 MG/ML IV BOLUS
INTRAVENOUS | Status: DC | PRN
  Administered 2017-12-18: 200 mg via INTRAVENOUS

## 2017-12-18 MED ORDER — FENTANYL CITRATE (PF) 100 MCG/2ML IJ SOLN: 25.0000 ug | INTRAMUSCULAR | Status: DC | PRN

## 2017-12-18 MED ORDER — MIDAZOLAM HCL 2 MG/2ML IJ SOLN
INTRAMUSCULAR | Status: DC | PRN
  Administered 2017-12-18: 2 mg via INTRAVENOUS

## 2017-12-18 MED ORDER — LACTATED RINGERS IV SOLN: INTRAVENOUS | Status: DC

## 2017-12-18 MED ORDER — KETOROLAC TROMETHAMINE 30 MG/ML IJ SOLN
INTRAMUSCULAR | Status: DC | PRN
  Administered 2017-12-18: 30 mg via INTRAVENOUS

## 2017-12-18 MED ORDER — CEFAZOLIN SODIUM-DEXTROSE 2-4 GM/100ML-% IV SOLN
2.0000 g | Freq: Once | INTRAVENOUS | Status: AC
  Administered 2017-12-18: 2 g via INTRAVENOUS

## 2017-12-18 MED ORDER — LIDOCAINE HCL (CARDIAC) 20 MG/ML IV SOLN
INTRAVENOUS | Status: DC | PRN
  Administered 2017-12-18: 100 mg via INTRAVENOUS

## 2017-12-18 MED ORDER — FENTANYL CITRATE (PF) 100 MCG/2ML IJ SOLN
INTRAMUSCULAR | Status: DC | PRN
  Administered 2017-12-18: 100 ug via INTRAVENOUS

## 2017-12-18 MED ORDER — ONDANSETRON HCL 4 MG/2ML IJ SOLN
INTRAMUSCULAR | Status: DC | PRN
  Administered 2017-12-18: 4 mg via INTRAVENOUS

## 2017-12-18 MED ORDER — LACTATED RINGERS IV SOLN
INTRAVENOUS | Status: DC
  Administered 2017-12-18: 13:00:00 via INTRAVENOUS

## 2017-12-18 SURGICAL SUPPLY — 20 items
ABLATOR ENDOMETRIAL MYOSURE (ABLATOR) IMPLANT
CANISTER SUC SOCK COL 7IN (MISCELLANEOUS) ×3 IMPLANT
CANISTER SUCT 3000ML PPV (MISCELLANEOUS) ×3 IMPLANT
CATH ROBINSON RED A/P 16FR (CATHETERS) ×3 IMPLANT
DEVICE MYOSURE LITE (MISCELLANEOUS) ×3 IMPLANT
GLOVE BIO SURGEON STRL SZ8 (GLOVE) ×3 IMPLANT
GOWN STRL REUS W/ TWL LRG LVL3 (GOWN DISPOSABLE) ×1 IMPLANT
GOWN STRL REUS W/ TWL XL LVL3 (GOWN DISPOSABLE) ×1 IMPLANT
GOWN STRL REUS W/TWL LRG LVL3 (GOWN DISPOSABLE) ×2
GOWN STRL REUS W/TWL XL LVL3 (GOWN DISPOSABLE) ×2
KIT TURNOVER CYSTO (KITS) ×3 IMPLANT
PACK DNC HYST (MISCELLANEOUS) ×3 IMPLANT
PAD OB MATERNITY 4.3X12.25 (PERSONAL CARE ITEMS) ×3 IMPLANT
PAD PREP 24X41 OB/GYN DISP (PERSONAL CARE ITEMS) ×3 IMPLANT
SOL .9 NS 3000ML IRR  AL (IV SOLUTION) ×2
SOL .9 NS 3000ML IRR UROMATIC (IV SOLUTION) ×1 IMPLANT
TOWEL OR 17X26 4PK STRL BLUE (TOWEL DISPOSABLE) ×3 IMPLANT
TUBING CONNECTING 10 (TUBING) ×2 IMPLANT
TUBING CONNECTING 10' (TUBING) ×1
TUBING HYSTEROSCOPY DOLPHIN (MISCELLANEOUS) ×3 IMPLANT

## 2017-12-18 NOTE — Brief Op Note (Signed)
12/18/2017  2:00 PM  PATIENT:  Daisy Rivera  52 y.o. female  PRE-OPERATIVE DIAGNOSIS:  Endometrial Mass  PMB  POST-OPERATIVE DIAGNOSIS:  PMB , endometrial polyp  PROCEDURE:   Fractional D+C ,  Hysteroscopy resection of endometrial polyp with Myosure  SURGEON:  Surgeon(s) and Role:    * Davyn Morandi, Gwen Her, MD - Primary  PHYSICIAN ASSISTANT:   ASSISTANTS: none   ANESTHESIA:   general  EBL: 5 cc, IOF 600cc UO 200cc, deficit 180 cc NS BLOOD ADMINISTERED:none  DRAINS: none LOCAL MEDICATIONS USED:  NONE  SPECIMEN:  Source of Specimen:  ECC , endometrial polyp and endometrial curettings   DISPOSITION OF SPECIMEN:  PATHOLOGY  COUNTS:  YES  TOURNIQUET:  * No tourniquets in log *  DICTATION: .Other Dictation: Dictation Number verbal   PLAN OF CARE: Discharge to home after PACU  PATIENT DISPOSITION:  PACU - hemodynamically stable.   Delay start of Pharmacological VTE agent (>24hrs) due to surgical blood loss or risk of bleeding: not applicable

## 2017-12-18 NOTE — Anesthesia Post-op Follow-up Note (Signed)
Anesthesia QCDR form completed.        

## 2017-12-18 NOTE — Anesthesia Procedure Notes (Deleted)
Performed by: Rudean Hitt, CRNA

## 2017-12-18 NOTE — Anesthesia Postprocedure Evaluation (Signed)
Anesthesia Post Note  Patient: Daisy Rivera  Procedure(s) Performed: DILATATION & CURETTAGE/HYSTEROSCOPY WITH MYOSURE (N/A )  Patient location during evaluation: PACU Anesthesia Type: General Level of consciousness: awake and alert and oriented Pain management: pain level controlled Vital Signs Assessment: post-procedure vital signs reviewed and stable Respiratory status: spontaneous breathing Cardiovascular status: blood pressure returned to baseline Anesthetic complications: no     Last Vitals:  Vitals:   12/18/17 1457 12/18/17 1513  BP: (!) 149/77 (!) 148/71  Pulse: 62 68  Resp: 18 18  Temp:    SpO2: 100% 100%    Last Pain:  Vitals:   12/18/17 1513  TempSrc: Temporal  PainSc: 3                  Kaarin Pardy

## 2017-12-18 NOTE — Discharge Instructions (Signed)
AMBULATORY SURGERY  DISCHARGE INSTRUCTIONS   1) The drugs that you were given will stay in your system until tomorrow so for the next 24 hours you should not:  A) Drive an automobile B) Make any legal decisions C) Drink any alcoholic beverage   2) You may resume regular meals tomorrow.  Today it is better to start with liquids and gradually work up to solid foods.  You may eat anything you prefer, but it is better to start with liquids, then soup and crackers, and gradually work up to solid foods.   3) Please notify your doctor immediately if you have any unusual bleeding, trouble breathing, redness and pain at the surgery site, drainage, fever, or pain not relieved by medication.    Additional Instructions:        Dilation and Curettage or Vacuum Curettage, Care After These instructions give you information about caring for yourself after your procedure. Your doctor may also give you more specific instructions. Call your doctor if you have any problems or questions after your procedure. Follow these instructions at home: Activity Do not drive or use heavy machinery while taking prescription pain medicine. For 24 hours after your procedure, avoid driving. Take short walks often, followed by rest periods. Ask your doctor what activities are safe for you. After one or two days, you may be able to return to your normal activities. Do not lift anything that is heavier than 10 lb (4.5 kg) until your doctor approves. For at least 2 weeks, or as long as told by your doctor: Do not douche. Do not use tampons. Do not have sex. General instructions Take over-the-counter and prescription medicines only as told by your doctor. This is very important if you take blood thinning medicine. Do not take baths, swim, or use a hot tub until your doctor approves. Take showers instead of baths. Wear compression stockings as told by your doctor. It is up to you to get the results of your procedure.  Ask your doctor when your results will be ready. Keep all follow-up visits as told by your doctor. This is important. Contact a doctor if: You have very bad cramps that get worse or do not get better with medicine. You have very bad pain in your belly (abdomen). You cannot drink fluids without throwing up (vomiting). You get pain in a different part of the area between your belly and thighs (pelvis). You have bad-smelling discharge from your vagina. You have a rash. Get help right away if: You are bleeding a lot from your vagina. A lot of bleeding means soaking more than one sanitary pad in an hour, for 2 hours in a row. You have clumps of blood (blood clots) coming from your vagina. You have a fever or chills. Your belly feels very tender or hard. You have chest pain. You have trouble breathing. You cough up blood. You feel dizzy. You feel light-headed. You pass out (faint). You have pain in your neck or shoulder area. Summary Take short walks often, followed by rest periods. Ask your doctor what activities are safe for you. After one or two days, you may be able to return to your normal activities. Do not lift anything that is heavier than 10 lb (4.5 kg) until your doctor approves. Do not take baths, swim, or use a hot tub until your doctor approves. Take showers instead of baths. Contact your doctor if you have any symptoms of infection, like bad-smelling discharge from your vagina. This information  is not intended to replace advice given to you by your health care provider. Make sure you discuss any questions you have with your health care provider. Document Released: 07/08/2008 Document Revised: 06/16/2016 Document Reviewed: 06/16/2016 Elsevier Interactive Patient Education  2017 [employer]. 4)         Please contact your physician with any problems or Same Day Surgery at [phone], Monday through Friday 6 am to 4 pm, or [place] at [facility] number at  [phone].

## 2017-12-18 NOTE — Transfer of Care (Signed)
Immediate Anesthesia Transfer of Care Note  Patient: Daisy Rivera Bonus  Procedure(s) Performed: DILATATION & CURETTAGE/HYSTEROSCOPY WITH MYOSURE (N/A )  Patient Location: PACU  Anesthesia Type:General  Level of Consciousness: drowsy  Airway & Oxygen Therapy: Patient Spontanous Breathing  Post-op Assessment: Report given to RN and Post -op Vital signs reviewed and stable  Post vital signs: Reviewed and stable  Last Vitals:  Vitals:   12/18/17 1225 12/18/17 1413  BP: (!) 162/71 128/60  Pulse: 81 79  Resp: 17 12  Temp: (!) 36.3 C (!) 36.2 C  SpO2: 99% 100%    Last Pain:  Vitals:   12/18/17 1225  TempSrc: Temporal         Complications: No apparent anesthesia complications

## 2017-12-18 NOTE — Anesthesia Procedure Notes (Addendum)
Procedure Name: Intubation Date/Time: 12/18/2017 1:23 PM Performed by: Rudean Hitt, CRNA Pre-anesthesia Checklist: Patient identified, Emergency Drugs available, Suction available, Patient being monitored and Timeout performed Patient Re-evaluated:Patient Re-evaluated prior to induction Oxygen Delivery Method: Circle system utilized Preoxygenation: Pre-oxygenation with 100% oxygen Induction Type: IV induction Laryngoscope Size: Mac and 4 Grade View: Grade II Tube type: Oral Tube size: 7.0 mm Number of attempts: 1 Airway Equipment and Method: Stylet Placement Confirmation: ETT inserted through vocal cords under direct vision,  positive ETCO2,  CO2 detector and breath sounds checked- equal and bilateral Secured at: 21 cm Tube secured with: Tape

## 2017-12-18 NOTE — Anesthesia Procedure Notes (Deleted)
Performed by: Rudean Hitt, CRNA Oxygen Delivery Method: Simple face mask

## 2017-12-18 NOTE — Anesthesia Preprocedure Evaluation (Signed)
Anesthesia Evaluation  Patient identified by MRN, date of birth, ID band Patient awake    Reviewed: Allergy & Precautions, H&P , NPO status , Patient's Chart, lab work & pertinent test results, reviewed documented beta blocker date and time   Airway Mallampati: II  TM Distance: <3 FB Neck ROM: full    Dental  (+) Poor Dentition, Chipped   Pulmonary neg pulmonary ROS, shortness of breath and with exertion,    Pulmonary exam normal        Cardiovascular negative cardio ROS Normal cardiovascular exam Rhythm:regular Rate:Normal     Neuro/Psych  Headaches, Anxiety negative neurological ROS  negative psych ROS   GI/Hepatic negative GI ROS, Neg liver ROS,   Endo/Other  negative endocrine ROSMorbid obesity  Renal/GU negative Renal ROS  Female GU complaint     Musculoskeletal   Abdominal   Peds negative pediatric ROS (+)  Hematology negative hematology ROS (+) anemia ,   Anesthesia Other Findings Past Medical History: No date: Allergic rhinitis No date: Anemia 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 No date: Headache No date: Hypercholesterolemia No date: Obesity Past Surgical History: 1999: CESAREAN SECTION 06/02/2014: COLONOSCOPY 10/14/2017: COLONOSCOPY; N/A     Comment:  Procedure: COLONOSCOPY;  Surgeon: Manya Silvas, MD;              Location: [facility] ENDOSCOPY;  Service: Endoscopy;                Laterality: N/A; 1995: DILATION AND CURETTAGE, DIAGNOSTIC / THERAPEUTIC 08/03/2014: FLEXIBLE SIGMOIDOSCOPY BMI    Body Mass Index:  48.73 kg/m     Reproductive/Obstetrics negative OB ROS                             Anesthesia Physical  Anesthesia Plan  ASA: III  Anesthesia Plan: General   Post-op Pain Management:    Induction: Intravenous, Rapid sequence and Cricoid pressure planned  PONV Risk Score and Plan:   Airway Management Planned: Oral ETT  Additional Equipment:    Intra-op Plan:   Post-operative Plan: Extubation in OR  Informed Consent: I have reviewed the patients History and Physical, chart, labs and discussed the procedure including the risks, benefits and alternatives for the proposed anesthesia with the patient or authorized representative who has indicated his/her understanding and acceptance.   Dental Advisory Given  Plan Discussed with: CRNA and Surgeon  Anesthesia Plan Comments:         Anesthesia Quick Evaluation

## 2017-12-18 NOTE — Progress Notes (Signed)
Pt ready for Fx D+C and myosure . All questions answered  Proceed

## 2017-12-19 ENCOUNTER — Encounter: Payer: Self-pay | Admitting: Obstetrics and Gynecology

## 2017-12-19 NOTE — Op Note (Signed)
NAMEPERMELIA, BAMBA NO.:  [id number]  MEDICAL RECORD NO.:  [id number]  LOCATION:  [facility]                         FACILITY:  [facility]  PHYSICIAN:  Laverta Baltimore, MDDATE OF BIRTH:  [birth year]  DATE OF PROCEDURE: DATE OF DISCHARGE:                              OPERATIVE REPORT   PREOPERATIVE DIAGNOSIS: 1. Postmenopausal bleeding. 2. Endometrial polyp.  POSTOPERATIVE DIAGNOSIS: 1. Postmenopausal bleeding. 2. Endometrial polyp.  PROCEDURE PERFORMED: 1. Fractional dilation curettage. 2. Hysteroscopic resection of the endometrial polyp with MyoSure.  SURGEON:  Laverta Baltimore, MD  ANESTHESIA:  General endotracheal anesthesia.  INDICATIONS:  A 52 year old gravida 3, para 2 patient with a history of postmenopausal bleeding.  The patient underwent a saline infusion sonohysterography in the office that showed endometrial mass measuring 1.4 x 1.2 x 1.5 cm.  FINDINGS:  Endometrial polyp, posterior aspect of the endometrium.  DESCRIPTION OF PROCEDURE:  After adequate general endotracheal anesthesia, the patient was placed on dorsal supine position with the legs in the candy-cane stirrups.  The patient was prepped and draped in normal sterile fashion.  The patient did receive 3 g IV Ancef prior to commencement of the case.  Time-out was performed.  Straight catheterization of the bladder yielded 200 mL clear urine.  A weighted speculum was placed in the posterior vaginal vault, and the anterior cervix was grasped with single-tooth tenaculum.  An endocervical curettage was performed followed by uterine sounding to 8 cm.  The cervix was then dilated to #18 Hanks dilator without difficulty.  The hysteroscope was advanced into the endometrial cavity without difficulty and normal saline was used as distending medium.  Of note, there was a fleshy 1 x 1 cm polyp in the posterior aspect of the endometrial cavity. MyoSure was brought up to the operative field and  the polyp was resected in total.  Good hemostasis was noted.  Intraoperative pictures were taken.  The MyoSure and hysteroscope were removed and a gentle endometrial curetting was performed with no additional tissue, scant blood.  This will be sent to pathology as well.  The single-tooth tenaculum was removed.  Silver nitrate was used at the tenaculum sites. Good hemostasis was noted.  There were no complications.  ESTIMATED BLOOD LOSS:  5 mL.  INTRAOPERATIVE FLUIDS:  600 mL.  URINE OUTPUT:  200 mL net deficit.  Normal saline 180 mL.  The patient was taken to recovery room in good condition.          ______________________________ Laverta Baltimore, MD     TS/MEDQ  D:  12/18/2017  T:  12/19/2017  Job:  [id number]

## 2017-12-22 LAB — SURGICAL PATHOLOGY

## 2019-07-20 ENCOUNTER — Other Ambulatory Visit: Payer: Self-pay | Admitting: Otolaryngology

## 2019-07-20 ENCOUNTER — Ambulatory Visit
Admission: RE | Admit: 2019-07-20 | Discharge: 2019-07-20 | Disposition: A | Discharge status: Home / Self Care | Payer: BC Managed Care – PPO | Attending: Otolaryngology | Admitting: Otolaryngology

## 2019-07-20 ENCOUNTER — Other Ambulatory Visit: Payer: Self-pay

## 2019-07-20 ENCOUNTER — Ambulatory Visit
Admission: RE | Admit: 2019-07-20 | Discharge: 2019-07-20 | Disposition: A | Discharge status: Home / Self Care | Payer: BC Managed Care – PPO | Source: Ambulatory Visit | Attending: Otolaryngology | Admitting: Otolaryngology

## 2019-07-20 DIAGNOSIS — R52 Pain, unspecified: Secondary | ICD-10-CM | POA: Insufficient documentation

## 2019-09-22 ENCOUNTER — Other Ambulatory Visit: Payer: Self-pay | Admitting: Obstetrics and Gynecology

## 2019-09-22 DIAGNOSIS — Z1231 Encounter for screening mammogram for malignant neoplasm of breast: Secondary | ICD-10-CM

## 2019-09-28 ENCOUNTER — Ambulatory Visit
Admission: RE | Admit: 2019-09-28 | Discharge: 2019-09-28 | Disposition: A | Discharge status: Home / Self Care | Payer: BC Managed Care – PPO | Source: Ambulatory Visit | Attending: Obstetrics and Gynecology | Admitting: Obstetrics and Gynecology

## 2019-09-28 ENCOUNTER — Other Ambulatory Visit: Payer: Self-pay

## 2019-09-28 DIAGNOSIS — Z1231 Encounter for screening mammogram for malignant neoplasm of breast: Secondary | ICD-10-CM

## 2020-05-16 ENCOUNTER — Emergency Department
Admission: EM | Admit: 2020-05-16 | Discharge: 2020-05-16 | Disposition: A | Discharge status: Home / Self Care | Payer: BC Managed Care – PPO | Attending: Emergency Medicine | Admitting: Emergency Medicine

## 2020-05-16 ENCOUNTER — Emergency Department: Payer: BC Managed Care – PPO

## 2020-05-16 ENCOUNTER — Encounter: Payer: Self-pay | Admitting: Emergency Medicine

## 2020-05-16 ENCOUNTER — Other Ambulatory Visit: Payer: Self-pay

## 2020-05-16 DIAGNOSIS — R519 Headache, unspecified: Secondary | ICD-10-CM | POA: Insufficient documentation

## 2020-05-16 DIAGNOSIS — M79631 Pain in right forearm: Secondary | ICD-10-CM | POA: Insufficient documentation

## 2020-05-16 DIAGNOSIS — M7918 Myalgia, other site: Secondary | ICD-10-CM

## 2020-05-16 DIAGNOSIS — Y999 Unspecified external cause status: Secondary | ICD-10-CM | POA: Diagnosis not present

## 2020-05-16 DIAGNOSIS — Y939 Activity, unspecified: Secondary | ICD-10-CM | POA: Diagnosis not present

## 2020-05-16 DIAGNOSIS — Z7982 Long term (current) use of aspirin: Secondary | ICD-10-CM | POA: Diagnosis not present

## 2020-05-16 DIAGNOSIS — Y92410 Unspecified street and highway as the place of occurrence of the external cause: Secondary | ICD-10-CM | POA: Insufficient documentation

## 2020-05-16 DIAGNOSIS — R0789 Other chest pain: Secondary | ICD-10-CM | POA: Insufficient documentation

## 2020-05-16 DIAGNOSIS — Z85040 Personal history of malignant carcinoid tumor of rectum: Secondary | ICD-10-CM | POA: Diagnosis not present

## 2020-05-16 DIAGNOSIS — S161XXA Strain of muscle, fascia and tendon at neck level, initial encounter: Secondary | ICD-10-CM | POA: Diagnosis not present

## 2020-05-16 DIAGNOSIS — S199XXA Unspecified injury of neck, initial encounter: Secondary | ICD-10-CM | POA: Diagnosis present

## 2020-05-16 DIAGNOSIS — H9202 Otalgia, left ear: Secondary | ICD-10-CM | POA: Diagnosis not present

## 2020-05-16 MED ORDER — OXYCODONE-ACETAMINOPHEN 7.5-325 MG PO TABS: 1.0000 | ORAL_TABLET | Freq: Four times a day (QID) | ORAL | 0 refills | Status: AC | PRN

## 2020-05-16 MED ORDER — ORPHENADRINE CITRATE 30 MG/ML IJ SOLN
60.0000 mg | Freq: Two times a day (BID) | INTRAMUSCULAR | Status: DC
  Administered 2020-05-16: 60 mg via INTRAMUSCULAR
  Filled 2020-05-16: qty 2

## 2020-05-16 MED ORDER — CYCLOBENZAPRINE HCL 10 MG PO TABS: 10.0000 mg | ORAL_TABLET | Freq: Three times a day (TID) | ORAL | 0 refills | Status: AC | PRN

## 2020-05-16 MED ORDER — ONDANSETRON 8 MG PO TBDP
8.0000 mg | ORAL_TABLET | Freq: Once | ORAL | Status: AC
  Administered 2020-05-16: 8 mg via ORAL
  Filled 2020-05-16: qty 1

## 2020-05-16 MED ORDER — HYDROMORPHONE HCL 1 MG/ML IJ SOLN
1.0000 mg | Freq: Once | INTRAMUSCULAR | Status: AC
  Administered 2020-05-16: 1 mg via INTRAMUSCULAR
  Filled 2020-05-16: qty 1

## 2020-05-16 NOTE — Discharge Instructions (Addendum)
No acute findings on CT of the head and neck.  Follow discharge care instruction.  Continue previous medications.  Start Percocet and Flexeril as directed.  Follow-up PCP if no improvement.
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
## 2021-04-03 ENCOUNTER — Other Ambulatory Visit: Payer: Self-pay | Admitting: Orthopedic Surgery

## 2021-04-03 DIAGNOSIS — S46001A Unspecified injury of muscle(s) and tendon(s) of the rotator cuff of right shoulder, initial encounter: Secondary | ICD-10-CM

## 2021-04-03 DIAGNOSIS — M25311 Other instability, right shoulder: Secondary | ICD-10-CM

## 2021-04-13 ENCOUNTER — Ambulatory Visit
Admission: RE | Admit: 2021-04-13 | Discharge: 2021-04-13 | Disposition: A | Discharge status: Home / Self Care | Payer: BC Managed Care – PPO | Source: Ambulatory Visit | Attending: Orthopedic Surgery | Admitting: Orthopedic Surgery

## 2021-04-13 ENCOUNTER — Other Ambulatory Visit: Payer: Self-pay

## 2021-04-13 DIAGNOSIS — S46001A Unspecified injury of muscle(s) and tendon(s) of the rotator cuff of right shoulder, initial encounter: Secondary | ICD-10-CM

## 2021-04-13 DIAGNOSIS — M25311 Other instability, right shoulder: Secondary | ICD-10-CM

## 2021-05-17 ENCOUNTER — Other Ambulatory Visit: Payer: Self-pay | Admitting: Obstetrics and Gynecology

## 2021-05-17 DIAGNOSIS — Z1231 Encounter for screening mammogram for malignant neoplasm of breast: Secondary | ICD-10-CM

## 2021-07-02 ENCOUNTER — Ambulatory Visit
Admission: RE | Admit: 2021-07-02 | Discharge: 2021-07-02 | Disposition: A | Discharge status: Home / Self Care | Payer: BC Managed Care – PPO | Source: Ambulatory Visit | Attending: Obstetrics and Gynecology | Admitting: Obstetrics and Gynecology

## 2021-07-02 ENCOUNTER — Other Ambulatory Visit: Payer: Self-pay

## 2021-07-02 DIAGNOSIS — Z1231 Encounter for screening mammogram for malignant neoplasm of breast: Secondary | ICD-10-CM | POA: Insufficient documentation

## 2022-07-09 ENCOUNTER — Other Ambulatory Visit: Payer: Self-pay | Admitting: Obstetrics and Gynecology

## 2022-07-09 DIAGNOSIS — Z1231 Encounter for screening mammogram for malignant neoplasm of breast: Secondary | ICD-10-CM

## 2022-08-04 ENCOUNTER — Ambulatory Visit
Admission: RE | Admit: 2022-08-04 | Discharge: 2022-08-04 | Disposition: A | Discharge status: Home / Self Care | Payer: BC Managed Care – PPO | Source: Ambulatory Visit | Attending: Obstetrics and Gynecology | Admitting: Obstetrics and Gynecology

## 2022-08-04 DIAGNOSIS — Z1231 Encounter for screening mammogram for malignant neoplasm of breast: Secondary | ICD-10-CM | POA: Diagnosis present

## 2023-02-13 ENCOUNTER — Encounter: Payer: Self-pay | Admitting: Gastroenterology

## 2023-02-15 NOTE — H&P (Signed)
Pre-Procedure H&P   Patient ID: Daisy Rivera is a 57 y.o. female.  Gastroenterology Provider: Jaynie Collins, DO  Referring Provider: Laren Everts, NP PCP: Luciana Axe, NP  Date: 02/16/2023  HPI Ms. Daisy Rivera is a 57 y.o. female who presents today for Colonoscopy for surveillance- phx colon polyps and rectal carcinoid .  Patient last underwent colonoscopy in January 2019 demonstrating internal hemorrhoids.  She underwent colonoscopy demonstrating well-differentiated rectal that.  Repeat flex sig in October 2015 was negative  Daily bowel meant without melena hematochezia diarrhea or constipation.  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   Claustrophobia    Dyspnea    Headache    Hypercholesterolemia    Obesity     Past Surgical History:  Procedure Laterality Date   CESAREAN SECTION  1999   COLONOSCOPY  06/02/2014   COLONOSCOPY N/A 10/14/2017   Procedure: COLONOSCOPY;  Surgeon: Scot Jun, MD;  Location: [facility] ENDOSCOPY;  Service: Endoscopy;  Laterality: N/A;   DILATATION & CURETTAGE/HYSTEROSCOPY WITH MYOSURE N/A 12/18/2017   Procedure: DILATATION & CURETTAGE/HYSTEROSCOPY WITH MYOSURE;  Surgeon: Schermerhorn, Ihor Austin, MD;  Location: [facility] ORS;  Service: Gynecology;  Laterality: N/A;   DILATION AND CURETTAGE OF UTERUS     DILATION AND CURETTAGE, DIAGNOSTIC / THERAPEUTIC  1995   FLEXIBLE SIGMOIDOSCOPY  08/03/2014    Family History No h/o GI disease or malignancy  Review of Systems  Constitutional:  Negative for activity change, appetite change, chills, diaphoresis, fatigue, fever and unexpected weight change.  HENT:  Negative for trouble swallowing and voice change.   Respiratory:  Negative for shortness of breath and wheezing.   Cardiovascular:  Negative for chest pain, palpitations and leg swelling.   Gastrointestinal:  Negative for abdominal distention, abdominal pain, anal bleeding, blood in stool, constipation, diarrhea, nausea, rectal pain and vomiting.  Musculoskeletal:  Negative for arthralgias and myalgias.  Skin:  Negative for color change and pallor.  Neurological:  Negative for dizziness, syncope and weakness.  Psychiatric/Behavioral:  Negative for confusion.   All other systems reviewed and are negative.    Medications No current facility-administered medications on file prior to encounter.   Current Outpatient Medications on File Prior to Encounter  Medication Sig Dispense Refill   acetaminophen (TYLENOL) 500 MG tablet Take 1,000 mg by mouth 2 (two) times daily as needed for moderate pain or headache.     aspirin EC 81 MG tablet Take 81 mg by mouth at bedtime.     celecoxib (CELEBREX) 200 MG capsule Take 200 mg by mouth 2 (two) times daily.     Multiple Vitamin (MULTIVITAMIN) capsule Take 1 capsule by mouth daily.     Omega-3 Fatty Acids (FISH OIL) 500 MG CAPS Take 1,000 mg by mouth daily.     SUPER B COMPLEX/C PO Take 1 tablet by mouth daily.     vitamin C (ASCORBIC ACID) 500 MG tablet Take 500 mg by mouth daily.     Calcium Citrate-Vitamin D 200-250 MG-UNIT TABS Take 1 tablet by mouth daily.      cyclobenzaprine (FLEXERIL) 10 MG tablet Take 1 tablet (10 mg total) by mouth 3 (three) times daily as needed. 15 tablet 0   diphenhydrAMINE (BENADRYL) 12.5 MG/5ML elixir Take 12.5 mg by mouth daily as needed for allergies.      EPINEPHrine 0.3  mg/0.3 mL IJ SOAJ injection Inject 0.3 mg into the muscle as needed (allergic reactions).      ferrous sulfate 325 (65 FE) MG tablet Take 325 mg by mouth 2 (two) times daily.  (Patient not taking: Reported on 02/09/2023)     fexofenadine (ALLEGRA) 180 MG tablet Take 180 mg by mouth daily.     glucosamine-chondroitin 500-400 MG tablet Take 2 tablets by mouth daily.      ibuprofen (ADVIL,MOTRIN) 600 MG tablet Take 400 mg by mouth 2 (two)  times daily as needed for headache or moderate pain.  (Patient not taking: Reported on 02/16/2023)     Inulin 2 g CHEW Chew 6 g by mouth daily.      medroxyPROGESTERone (PROVERA) 10 MG tablet Take 10 mg by mouth daily as needed (irregular cycles PRN). For 10 days at a time     oxyCODONE-acetaminophen (PERCOCET) 7.5-325 MG tablet Take 1 tablet by mouth every 6 (six) hours as needed. 20 tablet 0   PRESCRIPTION MEDICATION Allergy shot weekly      Pertinent medications related to GI and procedure were reviewed by me with the patient prior to the procedure   Current Facility-Administered Medications:    0.9 %  sodium chloride infusion, , Intravenous, Continuous, Jaynie Collins, DO  sodium chloride         Allergies  Allergen Reactions   Codeine Nausea Only   Vicodin [Hydrocodone-Acetaminophen] Other (See Comments)    Hallucinations    Allergies were reviewed by me prior to the procedure  Objective   Body mass index is 58.78 kg/m. Vitals:   02/16/23 0705  BP: (!) 167/84  Pulse: 87  Resp: 18  Temp: (!) 97 F (36.1 C)  TempSrc: Temporal  SpO2: 96%  Weight: (!) 175.4 kg  Height: 5\' 8"  (1.727 m)     Physical Exam Vitals and nursing note reviewed.  Constitutional:      General: She is not in acute distress.    Appearance: Normal appearance. She is obese. She is not ill-appearing, toxic-appearing or diaphoretic.  HENT:     Head: Normocephalic and atraumatic.     Nose: Nose normal.     Mouth/Throat:     Mouth: Mucous membranes are moist.     Pharynx: Oropharynx is clear.  Eyes:     General: No scleral icterus.    Extraocular Movements: Extraocular movements intact.  Cardiovascular:     Rate and Rhythm: Normal rate and regular rhythm.     Heart sounds: Normal heart sounds. No murmur heard.    No friction rub. No gallop.  Pulmonary:     Effort: Pulmonary effort is normal. No respiratory distress.     Breath sounds: Normal breath sounds. No wheezing, rhonchi or  rales.  Abdominal:     General: Bowel sounds are normal. There is no distension.     Palpations: Abdomen is soft.     Tenderness: There is no abdominal tenderness. There is no guarding or rebound.  Musculoskeletal:     Cervical back: Neck supple.     Right lower leg: No edema.     Left lower leg: No edema.  Skin:    General: Skin is warm and dry.     Coloration: Skin is not jaundiced or pale.  Neurological:     General: No focal deficit present.     Mental Status: She is alert and oriented to person, place, and time. Mental status is at baseline.  Psychiatric:  Mood and Affect: Mood normal.        Behavior: Behavior normal.        Thought Content: Thought content normal.        Judgment: Judgment normal.      Assessment:  Ms. Daisy Rivera is a 57 y.o. female  who presents today for Colonoscopy for surveillance- phx colon polyps and rectal carcinoid .  Plan:  Colonoscopy with possible intervention today  Colonoscopy with possible biopsy, control of bleeding, polypectomy, and interventions as necessary has been discussed with the patient/patient representative. Informed consent was obtained from the patient/patient representative after explaining the indication, nature, and risks of the procedure including but not limited to death, bleeding, perforation, missed neoplasm/lesions, cardiorespiratory compromise, and reaction to medications. Opportunity for questions was given and appropriate answers were provided. Patient/patient representative has verbalized understanding is amenable to undergoing the procedure.   Jaynie Collins, DO  [facility] Gastroenterology  Portions of the record may have been created with voice recognition software. Occasional wrong-word or 'sound-a-like' substitutions may have occurred due to the inherent limitations of voice recognition software.  Read the chart carefully and recognize, using context, where substitutions may have occurred.

## 2023-02-16 ENCOUNTER — Ambulatory Visit: Payer: 59 | Admitting: Certified Registered Nurse Anesthetist

## 2023-02-16 ENCOUNTER — Encounter
Admission: RE | Disposition: A | Discharge status: Home / Self Care | Payer: Self-pay | Source: Home / Self Care | Attending: Gastroenterology

## 2023-02-16 ENCOUNTER — Encounter: Payer: Self-pay | Admitting: Gastroenterology

## 2023-02-16 ENCOUNTER — Ambulatory Visit
Admission: RE | Admit: 2023-02-16 | Discharge: 2023-02-16 | Disposition: A | Discharge status: Home / Self Care | Payer: 59 | Attending: Gastroenterology | Admitting: Gastroenterology

## 2023-02-16 DIAGNOSIS — Z1211 Encounter for screening for malignant neoplasm of colon: Secondary | ICD-10-CM | POA: Diagnosis present

## 2023-02-16 DIAGNOSIS — D120 Benign neoplasm of cecum: Secondary | ICD-10-CM | POA: Insufficient documentation

## 2023-02-16 DIAGNOSIS — Z86010 Personal history of colonic polyps: Secondary | ICD-10-CM | POA: Diagnosis not present

## 2023-02-16 DIAGNOSIS — D125 Benign neoplasm of sigmoid colon: Secondary | ICD-10-CM | POA: Diagnosis not present

## 2023-02-16 DIAGNOSIS — Q438 Other specified congenital malformations of intestine: Secondary | ICD-10-CM | POA: Diagnosis not present

## 2023-02-16 DIAGNOSIS — Z6841 Body Mass Index (BMI) 40.0 and over, adult: Secondary | ICD-10-CM | POA: Insufficient documentation

## 2023-02-16 HISTORY — PX: COLONOSCOPY: SHX5424

## 2023-02-16 SURGERY — COLONOSCOPY
Anesthesia: General

## 2023-02-16 MED ORDER — LIDOCAINE HCL (CARDIAC) PF 100 MG/5ML IV SOSY
PREFILLED_SYRINGE | INTRAVENOUS | Status: DC | PRN
  Administered 2023-02-16: 50 mg via INTRAVENOUS

## 2023-02-16 MED ORDER — PROPOFOL 10 MG/ML IV BOLUS
INTRAVENOUS | Status: DC | PRN
  Administered 2023-02-16: 70 mg via INTRAVENOUS

## 2023-02-16 MED ORDER — PROPOFOL 500 MG/50ML IV EMUL
INTRAVENOUS | Status: DC | PRN
  Administered 2023-02-16: 150 ug/kg/min via INTRAVENOUS

## 2023-02-16 MED ORDER — SODIUM CHLORIDE 0.9 % IV SOLN: INTRAVENOUS | Status: DC

## 2023-02-16 MED ORDER — PROPOFOL 1000 MG/100ML IV EMUL
INTRAVENOUS | Status: AC
  Filled 2023-02-16: qty 100

## 2023-02-16 MED ORDER — LIDOCAINE HCL (PF) 2 % IJ SOLN
INTRAMUSCULAR | Status: AC
  Filled 2023-02-16: qty 5

## 2023-02-16 NOTE — Anesthesia Procedure Notes (Signed)
Date/Time: 02/16/2023 7:35 AM  Performed by: Ginger Carne, CRNAPre-anesthesia Checklist: Patient identified, Emergency Drugs available, Suction available, Patient being monitored and Timeout performed Patient Re-evaluated:Patient Re-evaluated prior to induction Oxygen Delivery Method: Nasal cannula Preoxygenation: Pre-oxygenation with 100% oxygen Induction Type: IV induction

## 2023-02-16 NOTE — Interval H&P Note (Signed)
History and Physical Interval Note: Preprocedure H&P from 02/16/23  was reviewed and there was no interval change after seeing and examining the patient.  Written consent was obtained from the patient after discussion of risks, benefits, and alternatives. Patient has consented to proceed with Colonoscopy with possible intervention   02/16/2023 7:28 AM  Daisy Rivera  has presented today for surgery, with the diagnosis of History of colon polyps (Z86.010) Benign carcinoid tumor of rectum (D3A.026).  The various methods of treatment have been discussed with the patient and family. After consideration of risks, benefits and other options for treatment, the patient has consented to  Procedure(s): COLONOSCOPY (N/A) as a surgical intervention.  The patient's history has been reviewed, patient examined, no change in status, stable for surgery.  I have reviewed the patient's chart and labs.  Questions were answered to the patient's satisfaction.     Jaynie Collins

## 2023-02-16 NOTE — Anesthesia Postprocedure Evaluation (Signed)
Anesthesia Post Note  Patient: Daisy Rivera  Procedure(s) Performed: COLONOSCOPY  Patient location during evaluation: Endoscopy Anesthesia Type: General Level of consciousness: awake and alert Pain management: pain level controlled Vital Signs Assessment: post-procedure vital signs reviewed and stable Respiratory status: spontaneous breathing, nonlabored ventilation, respiratory function stable and patient connected to nasal cannula oxygen Cardiovascular status: blood pressure returned to baseline and stable Postop Assessment: no apparent nausea or vomiting Anesthetic complications: no   No notable events documented.   Last Vitals:  Vitals:   02/16/23 0806 02/16/23 0808  BP: (!) 89/44 99/64  Pulse: 81   Resp: 18   Temp:  (!) 35.9 C  SpO2: 94%     Last Pain:  Vitals:   02/16/23 0840  TempSrc:   PainSc: 0-No pain                 Cleda Mccreedy Ikeem Cleckler

## 2023-02-16 NOTE — Op Note (Signed)
[facility] Gastroenterology Patient Name: Daisy Rivera Procedure Date: 02/16/2023 7:33 AM MRN: [id number] Account #: [id number] Date of Birth: 1965-12-17 Admit Type: Outpatient Age: 57 Room: [facility] ENDO ROOM 2 Gender: Female Note Status: Finalized Instrument Name: Colonoscope [id number] Procedure:             Colonoscopy Indications:           High risk colon cancer surveillance: Personal history                         of colonic polyps Providers:             Trenda Moots, DO Referring MD:          Lorenso Quarry (Referring MD) Medicines:             Monitored Anesthesia Care Complications:         No immediate complications. Estimated blood loss:                         Minimal. Procedure:             Pre-Anesthesia Assessment:                        - Prior to the procedure, a History and Physical was                         performed, and patient medications and allergies were                         reviewed. The patient is competent. The risks and                         benefits of the procedure and the sedation options and                         risks were discussed with the patient. All questions                         were answered and informed consent was obtained.                         Patient identification and proposed procedure were                         verified by the physician, the nurse, the anesthetist                         and the technician in the endoscopy suite. Mental                         Status Examination: alert and oriented. Airway                         Examination: normal oropharyngeal airway and neck                         mobility. Respiratory Examination: clear to  auscultation. CV Examination: RRR, no murmurs, no S3                         or S4. Prophylactic Antibiotics: The patient does not                         require prophylactic antibiotics. Prior                          Anticoagulants: The patient has taken no anticoagulant                         or antiplatelet agents. ASA Grade Assessment: III - A                         patient with severe systemic disease. After reviewing                         the risks and benefits, the patient was deemed in                         satisfactory condition to undergo the procedure. The                         anesthesia plan was to use monitored anesthesia care                         (MAC). Immediately prior to administration of                         medications, the patient was re-assessed for adequacy                         to receive sedatives. The heart rate, respiratory                         rate, oxygen saturations, blood pressure, adequacy of                         pulmonary ventilation, and response to care were                         monitored throughout the procedure. The physical                         status of the patient was re-assessed after the                         procedure.                        After obtaining informed consent, the colonoscope was                         passed under direct vision. Throughout the procedure,                         the patient's blood pressure, pulse, and oxygen  saturations were monitored continuously. The                         Colonoscope was introduced through the anus and                         advanced to the the cecum, identified by appendiceal                         orifice and ileocecal valve. The colonoscopy was                         performed without difficulty. The patient tolerated                         the procedure well. The quality of the bowel                         preparation was evaluated using the BBPS [facility] Bowel                         Preparation Scale) with scores of: Right Colon = 3,                         Transverse Colon = 3 and Left Colon = 3 (entire mucosa                         seen well  with no residual staining, small fragments                         of stool or opaque liquid). The total BBPS score                         equals 9. The ileocecal valve, appendiceal orifice,                         and rectum were photographed. Findings:      The perianal and digital rectal examinations were normal. Pertinent       negatives include normal sphincter tone.      Two sessile polyps were found in the sigmoid colon and cecum. The polyps       were 1 to 2 mm in size. These polyps were removed with a jumbo cold       forceps. Resection and retrieval were complete. Estimated blood loss was       minimal.      Scattered small-mouthed diverticula were found in the left colon.       Estimated blood loss was minimal.      The colon (entire examined portion) was mildly redundant. Estimated       blood loss: none.      The exam was otherwise without abnormality on direct and retroflexion       views. Impression:            - Two 1 to 2 mm polyps in the sigmoid colon and in the                         cecum, removed with a jumbo cold  forceps. Resected and                         retrieved.                        - Diverticulosis in the left colon.                        - Redundant colon.                        - The examination was otherwise normal on direct and                         retroflexion views. Recommendation:        - Patient has a contact number available for                         emergencies. The signs and symptoms of potential                         delayed complications were discussed with the patient.                         Return to normal activities tomorrow. Written                         discharge instructions were provided to the patient.                        - Discharge patient to home.                        - Resume previous diet.                        - Continue present medications.                        - Await pathology results.                         - Repeat colonoscopy for surveillance based on                         pathology results.                        - Return to referring physician as previously                         scheduled.                        - The findings and recommendations were discussed with                         the patient. Procedure Code(s):     --- Professional ---                        [phone], Colonoscopy, flexible; with biopsy, single or  multiple Diagnosis Code(s):     --- Professional ---                        Z86.010, Personal history of colonic polyps                        D12.5, Benign neoplasm of sigmoid colon                        D12.0, Benign neoplasm of cecum                        K57.30, Diverticulosis of large intestine without                         perforation or abscess without bleeding                        Q43.8, Other specified congenital malformations of                         intestine CPT copyright 2022 American Medical Association. All rights reserved. The codes documented in this report are preliminary and upon coder review may  be revised to meet current compliance requirements. Attending Participation:      I personally performed the entire procedure. Elfredia Nevins, DO Jaynie Collins DO, DO 02/16/2023 8:06:52 AM This report has been signed electronically. Number of Addenda: 0 Note Initiated On: 02/16/2023 7:33 AM Scope Withdrawal Time: 0 hours 15 minutes 53 seconds  Total Procedure Duration: 0 hours 20 minutes 46 seconds  Estimated Blood Loss:  Estimated blood loss was minimal.      [facility]

## 2023-02-16 NOTE — Transfer of Care (Signed)
Immediate Anesthesia Transfer of Care Note  Patient: Daisy Rivera  Procedure(s) Performed: COLONOSCOPY  Patient Location: Endoscopy Unit  Anesthesia Type:General  Level of Consciousness: awake and alert   Airway & Oxygen Therapy: Patient Spontanous Breathing  Post-op Assessment: Report given to RN and Post -op Vital signs reviewed and stable  Post vital signs: Reviewed and stable  Last Vitals:  Vitals Value Taken Time  BP 89/44 02/16/23 0806  Temp    Pulse 85 02/16/23 0806  Resp 20 02/16/23 0806  SpO2 94 % 02/16/23 0806  Vitals shown include unvalidated device data.  Last Pain:  Vitals:   02/16/23 0705  TempSrc: Temporal         Complications: No notable events documented.

## 2023-02-16 NOTE — Anesthesia Preprocedure Evaluation (Signed)
Anesthesia Evaluation  Patient identified by MRN, date of birth, ID band Patient awake    Reviewed: Allergy & Precautions, NPO status , Patient's Chart, lab work & pertinent test results  History of Anesthesia Complications Negative for: history of anesthetic complications  Airway Mallampati: III  TM Distance: <3 FB Neck ROM: full    Dental  (+) Chipped   Pulmonary shortness of breath and with exertion   Pulmonary exam normal        Cardiovascular Exercise Tolerance: Good (-) angina (-) Past MI negative cardio ROS Normal cardiovascular exam     Neuro/Psych  Headaches  negative psych ROS   GI/Hepatic negative GI ROS, Neg liver ROS,,,  Endo/Other    Morbid obesity  Renal/GU negative Renal ROS  negative genitourinary   Musculoskeletal   Abdominal   Peds  Hematology negative hematology ROS (+)   Anesthesia Other Findings Past Medical History: No date: Allergic rhinitis No date: Anemia 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 No date: Claustrophobia No date: Dyspnea No date: Headache No date: Hypercholesterolemia No date: Obesity  Past Surgical History: 1999: CESAREAN SECTION 06/02/2014: COLONOSCOPY 10/14/2017: COLONOSCOPY; N/A     Comment:  Procedure: COLONOSCOPY;  Surgeon: Scot Jun, MD;              Location: [facility] ENDOSCOPY;  Service: Endoscopy;                Laterality: N/A; 12/18/2017: DILATATION & CURETTAGE/HYSTEROSCOPY WITH MYOSURE; N/A     Comment:  Procedure: DILATATION & CURETTAGE/HYSTEROSCOPY WITH               MYOSURE;  Surgeon: Schermerhorn, Ihor Austin, MD;  Location:              [facility] ORS;  Service: Gynecology;  Laterality: N/A; No date: DILATION AND CURETTAGE OF UTERUS 1995: DILATION AND CURETTAGE, DIAGNOSTIC / THERAPEUTIC 08/03/2014: FLEXIBLE SIGMOIDOSCOPY  BMI    Body Mass Index: 58.78 kg/m      Reproductive/Obstetrics negative OB ROS                              Anesthesia Physical Anesthesia Plan  ASA: 3  Anesthesia Plan: General   Post-op Pain Management:    Induction: Intravenous  PONV Risk Score and Plan: Propofol infusion and TIVA  Airway Management Planned: Natural Airway and Nasal Cannula  Additional Equipment:   Intra-op Plan:   Post-operative Plan:   Informed Consent: I have reviewed the patients History and Physical, chart, labs and discussed the procedure including the risks, benefits and alternatives for the proposed anesthesia with the patient or authorized representative who has indicated his/her understanding and acceptance.     Dental Advisory Given  Plan Discussed with: Anesthesiologist, CRNA and Surgeon  Anesthesia Plan Comments: (Patient consented for risks of anesthesia including but not limited to:  - adverse reactions to medications - risk of airway placement if required - damage to eyes, teeth, lips or other oral mucosa - nerve damage due to positioning  - sore throat or hoarseness - Damage to heart, brain, nerves, lungs, other parts of body or loss of life  Patient voiced understanding.)       Anesthesia Quick Evaluation

## 2023-02-17 ENCOUNTER — Encounter: Payer: Self-pay | Admitting: Gastroenterology

## 2023-02-17 LAB — SURGICAL PATHOLOGY

## 2023-02-18 ENCOUNTER — Encounter: Payer: Self-pay | Admitting: Gastroenterology

## 2023-09-07 ENCOUNTER — Other Ambulatory Visit: Payer: Self-pay | Admitting: Obstetrics and Gynecology

## 2023-09-07 DIAGNOSIS — Z1231 Encounter for screening mammogram for malignant neoplasm of breast: Secondary | ICD-10-CM

## 2023-09-24 ENCOUNTER — Ambulatory Visit
Admission: RE | Admit: 2023-09-24 | Discharge: 2023-09-24 | Disposition: A | Discharge status: Home / Self Care | Payer: 59 | Source: Ambulatory Visit | Attending: Obstetrics and Gynecology | Admitting: Obstetrics and Gynecology

## 2023-09-24 DIAGNOSIS — Z1231 Encounter for screening mammogram for malignant neoplasm of breast: Secondary | ICD-10-CM | POA: Insufficient documentation

## 2024-10-24 ENCOUNTER — Other Ambulatory Visit: Payer: Self-pay | Admitting: Obstetrics and Gynecology

## 2024-10-24 DIAGNOSIS — Z1231 Encounter for screening mammogram for malignant neoplasm of breast: Secondary | ICD-10-CM

## 2024-11-22 ENCOUNTER — Ambulatory Visit
